# Patient Record
Sex: Male | Born: 1960 | Race: Black or African American | Hispanic: No | Marital: Married | Smoking: Never smoker
Health system: Southern US, Community
[De-identification: ages and names within clinical notes are randomized; demographics above are authoritative.]

## PROBLEM LIST (undated history)

## (undated) DIAGNOSIS — I10 Essential (primary) hypertension: Secondary | ICD-10-CM

## (undated) HISTORY — PX: KNEE SURGERY: SHX244

## (undated) HISTORY — PX: SHOULDER SURGERY: SHX246

---

## 2014-06-07 ENCOUNTER — Emergency Department: Payer: Self-pay | Admitting: Emergency Medicine

## 2014-06-07 LAB — CBC
HCT: 45.6 % (ref 40.0–52.0)
HGB: 14.8 g/dL (ref 13.0–18.0)
MCH: 28.5 pg (ref 26.0–34.0)
MCHC: 32.6 g/dL (ref 32.0–36.0)
MCV: 88 fL (ref 80–100)
Platelet: 197 10*3/uL (ref 150–440)
RBC: 5.2 10*6/uL (ref 4.40–5.90)
RDW: 13.7 % (ref 11.5–14.5)
WBC: 5.1 10*3/uL (ref 3.8–10.6)

## 2014-06-07 LAB — COMPREHENSIVE METABOLIC PANEL
ALT: 52 U/L
ANION GAP: 8 (ref 7–16)
AST: 49 U/L — AB (ref 15–37)
Albumin: 3.7 g/dL (ref 3.4–5.0)
Alkaline Phosphatase: 64 U/L
BILIRUBIN TOTAL: 0.5 mg/dL (ref 0.2–1.0)
BUN: 22 mg/dL — AB (ref 7–18)
CHLORIDE: 99 mmol/L (ref 98–107)
CREATININE: 1.35 mg/dL — AB (ref 0.60–1.30)
Calcium, Total: 8.7 mg/dL (ref 8.5–10.1)
Co2: 29 mmol/L (ref 21–32)
EGFR (African American): >60
EGFR (Non-African Amer.): >60
GLUCOSE: 122 mg/dL — AB (ref 65–99)
Osmolality: 277 (ref 275–301)
Potassium: 2.9 mmol/L — ABNORMAL LOW (ref 3.5–5.1)
Sodium: 136 mmol/L (ref 136–145)
TOTAL PROTEIN: 8.2 g/dL (ref 6.4–8.2)

## 2014-06-07 LAB — TROPONIN I
Troponin-I: <0.02 ng/mL
Troponin-I: <0.02 ng/mL

## 2017-07-03 ENCOUNTER — Emergency Department
Admission: EM | Admit: 2017-07-03 | Discharge: 2017-07-03 | Disposition: A | Discharge status: Home / Self Care | Payer: 59 | Attending: Emergency Medicine | Admitting: Emergency Medicine

## 2017-07-03 ENCOUNTER — Encounter: Payer: Self-pay | Admitting: Emergency Medicine

## 2017-07-03 DIAGNOSIS — R197 Diarrhea, unspecified: Secondary | ICD-10-CM | POA: Diagnosis not present

## 2017-07-03 DIAGNOSIS — B9621 Shiga toxin-producing Escherichia coli [E. coli] (STEC) O157 as the cause of diseases classified elsewhere: Secondary | ICD-10-CM | POA: Insufficient documentation

## 2017-07-03 DIAGNOSIS — E876 Hypokalemia: Secondary | ICD-10-CM | POA: Insufficient documentation

## 2017-07-03 DIAGNOSIS — R112 Nausea with vomiting, unspecified: Secondary | ICD-10-CM | POA: Insufficient documentation

## 2017-07-03 DIAGNOSIS — A498 Other bacterial infections of unspecified site: Secondary | ICD-10-CM

## 2017-07-03 DIAGNOSIS — I10 Essential (primary) hypertension: Secondary | ICD-10-CM | POA: Diagnosis not present

## 2017-07-03 HISTORY — DX: Essential (primary) hypertension: I10

## 2017-07-03 LAB — CBC
HCT: 44.3 % (ref 40.0–52.0)
Hemoglobin: 15.3 g/dL (ref 13.0–18.0)
MCH: 29.1 pg (ref 26.0–34.0)
MCHC: 34.5 g/dL (ref 32.0–36.0)
MCV: 84.5 fL (ref 80.0–100.0)
Platelets: 187 10*3/uL (ref 150–440)
RBC: 5.24 MIL/uL (ref 4.40–5.90)
RDW: 13.7 % (ref 11.5–14.5)
WBC: 5.7 10*3/uL (ref 3.8–10.6)

## 2017-07-03 LAB — COMPREHENSIVE METABOLIC PANEL
ALBUMIN: 4.2 g/dL (ref 3.5–5.0)
ALK PHOS: 60 U/L (ref 38–126)
ALT: 27 U/L (ref 17–63)
AST: 33 U/L (ref 15–41)
Anion gap: 11 (ref 5–15)
BILIRUBIN TOTAL: 0.6 mg/dL (ref 0.3–1.2)
BUN: 25 mg/dL — AB (ref 6–20)
CALCIUM: 9.2 mg/dL (ref 8.9–10.3)
CO2: 28 mmol/L (ref 22–32)
CREATININE: 1.19 mg/dL (ref 0.61–1.24)
Chloride: 97 mmol/L — ABNORMAL LOW (ref 101–111)
GFR calc Af Amer: >60 mL/min (ref >60)
GFR calc non Af Amer: >60 mL/min (ref >60)
GLUCOSE: 109 mg/dL — AB (ref 65–99)
Potassium: 2.6 mmol/L — CL (ref 3.5–5.1)
SODIUM: 136 mmol/L (ref 135–145)
TOTAL PROTEIN: 7.9 g/dL (ref 6.5–8.1)

## 2017-07-03 LAB — GASTROINTESTINAL PANEL BY PCR, STOOL (REPLACES STOOL CULTURE)
ASTROVIRUS: NOT DETECTED
Adenovirus F40/41: NOT DETECTED
CYCLOSPORA CAYETANENSIS: NOT DETECTED
Campylobacter species: NOT DETECTED
Cryptosporidium: NOT DETECTED
E. COLI O157: NOT DETECTED
ENTAMOEBA HISTOLYTICA: NOT DETECTED
ENTEROAGGREGATIVE E COLI (EAEC): NOT DETECTED
Enterotoxigenic E coli (ETEC): NOT DETECTED
Giardia lamblia: NOT DETECTED
NOROVIRUS GI/GII: NOT DETECTED
Plesimonas shigelloides: NOT DETECTED
Rotavirus A: NOT DETECTED
SALMONELLA SPECIES: NOT DETECTED
SAPOVIRUS (I, II, IV, AND V): NOT DETECTED
SHIGELLA/ENTEROINVASIVE E COLI (EIEC): NOT DETECTED
Shiga like toxin producing E coli (STEC): DETECTED — AB
VIBRIO CHOLERAE: NOT DETECTED
Vibrio species: NOT DETECTED
Yersinia enterocolitica: NOT DETECTED

## 2017-07-03 LAB — C DIFFICILE QUICK SCREEN W PCR REFLEX
C DIFFICILE (CDIFF) TOXIN: NEGATIVE
C DIFFICLE (CDIFF) ANTIGEN: NEGATIVE
C Diff interpretation: NOT DETECTED

## 2017-07-03 LAB — LIPASE, BLOOD: Lipase: 28 U/L (ref 11–51)

## 2017-07-03 MED ORDER — ONDANSETRON HCL 4 MG/2ML IJ SOLN
4.0000 mg | Freq: Once | INTRAMUSCULAR | Status: AC
  Administered 2017-07-03: 4 mg via INTRAVENOUS
  Filled 2017-07-03: qty 2

## 2017-07-03 MED ORDER — POTASSIUM CHLORIDE CRYS ER 20 MEQ PO TBCR
40.0000 meq | EXTENDED_RELEASE_TABLET | Freq: Once | ORAL | Status: AC
  Administered 2017-07-03: 40 meq via ORAL
  Filled 2017-07-03: qty 2

## 2017-07-03 MED ORDER — ONDANSETRON 4 MG PO TBDP: 4.0000 mg | ORAL_TABLET | Freq: Three times a day (TID) | ORAL | 0 refills | Status: DC | PRN

## 2017-07-03 MED ORDER — POTASSIUM CHLORIDE 10 MEQ/100ML IV SOLN
10.0000 meq | INTRAVENOUS | Status: AC
  Administered 2017-07-03 (×3): 10 meq via INTRAVENOUS
  Filled 2017-07-03 (×4): qty 100

## 2017-07-03 MED ORDER — ONDANSETRON 4 MG PO TBDP
4.0000 mg | ORAL_TABLET | Freq: Once | ORAL | Status: AC | PRN
  Administered 2017-07-03: 4 mg via ORAL
  Filled 2017-07-03: qty 1

## 2017-07-03 MED ORDER — SODIUM CHLORIDE 0.9 % IV BOLUS (SEPSIS)
1000.0000 mL | Freq: Once | INTRAVENOUS | Status: AC
  Administered 2017-07-03: 1000 mL via INTRAVENOUS

## 2017-07-03 NOTE — ED Triage Notes (Signed)
Patient come in with co diarrhea for last 3 days after eating at [employer].  Pt states it was formed at first and now he is use nauseated and the stool is mostly watery.  VS WNL.

## 2017-07-03 NOTE — ED Provider Notes (Signed)
[facility] Emergency Department Provider Note  ____________________________________________  Time seen: Approximately 7:31 AM  I have reviewed the triage vital signs and the nursing notes.   HISTORY  Chief Complaint Diarrhea and Nausea    HPI John Pennington is a 56 y.o. male with a history of hypertension, without any history of abdominal surgery, presenting with nausea without vomiting, and loose stool. The patient reports that he ate at [employer] yesterday, and within several hours began to develop loose, nonbloody watery and nonbloody stool associated with nausea but no vomiting. He has had 6-7 episodes of diarrhea since yesterday.He denies any fevers or chills, urinary symptoms, or abdominal pain. He has not tried anything for his pain. He has associated anorexia.   Past Medical History:  Diagnosis Date  . Hypertension     There are no active problems to display for this patient.   Past Surgical History:  Procedure Laterality Date  . KNEE SURGERY Left   . SHOULDER SURGERY Right     Current Outpatient Rx  . Order #: [id number] Class: Historical Med  . Order #: [id number] Class: Historical Med  . Order #: [id number] Class: Historical Med  . Order #: [id number] Class: Print    Allergies Patient has no known allergies.  No family history on file.  Social History Social History  Substance Use Topics  . Smoking status: Never Smoker  . Smokeless tobacco: Never Used  . Alcohol use No    Review of Systems Constitutional: No fever/chills.no lightheadedness or syncope. Eyes: No visual changes. ENT: No sore throat. No congestion or rhinorrhea. Cardiovascular: Denies chest pain. Denies palpitations. Respiratory: Denies shortness of breath.  No cough. Gastrointestinal: positive anorexia.No abdominal pain.  No nausea, no vomiting.  positivediarrhea.  No constipation. Genitourinary: Negative for dysuria. Musculoskeletal: Negative for back pain. Skin:  Negative for rash. Neurological: Negative for headaches. No focal numbness, tingling or weakness.     ____________________________________________   PHYSICAL EXAM:  VITAL SIGNS: ED Triage Vitals [07/03/17 0536]  Enc Vitals Group     BP 126/88     Pulse Rate 87     Resp 16     Temp 98.1 F (36.7 C)     Temp Source Oral     SpO2 97 %     Weight      Height      Head Circumference      Peak Flow      Pain Score      Pain Loc      Pain Edu?      Excl. in GC?     Constitutional: Alert and oriented. Well appearing and in no acute distress. Answers questions appropriately. Eyes: Conjunctivae are normal.  EOMI. No scleral icterus. Head: Atraumatic. Nose: No congestion/rhinnorhea. Mouth/Throat: Mucous membranes are mildly dry.  Neck: No stridor.  Supple.  No JVD. No meningismus. Cardiovascular: Normal rate, regular rhythm. No murmurs, rubs or gallops.  Respiratory: Normal respiratory effort.  No accessory muscle use or retractions. Lungs CTAB.  No wheezes, rales or ronchi. Gastrointestinal: overweight. Soft, nontender and nondistended.  No guarding or rebound.  No peritoneal signs. Musculoskeletal: moves all extremities well Neurologic:  A&Ox3.  Speech is clear.  Face and smile are symmetric.  EOMI.  Moves all extremities well. Skin:  Skin is warm, dry and intact. No rash noted. Psychiatric: Mood and affect are normal. Speech and behavior are normal.  Normal judgement.  ____________________________________________   LABS (all labs ordered are listed, but only abnormal results are  displayed)  Labs Reviewed  GASTROINTESTINAL PANEL BY PCR, STOOL (REPLACES STOOL CULTURE) - Abnormal; Notable for the following:       Result Value   Shiga like toxin producing E coli (STEC) DETECTED (*)    All other components within normal limits  COMPREHENSIVE METABOLIC PANEL - Abnormal; Notable for the following:    Potassium 2.6 (*)    Chloride 97 (*)    Glucose, Bld 109 (*)    BUN 25 (*)     All other components within normal limits  C DIFFICILE QUICK SCREEN W PCR REFLEX  LIPASE, BLOOD  CBC  URINALYSIS, COMPLETE (UACMP) WITH MICROSCOPIC   ____________________________________________  EKG  ED ECG REPORT I, Rockne Menghini, the attending physician, personally viewed and interpreted this ECG.   Date: 07/03/2017  EKG Time: 806  Rate: 72  Rhythm: normal sinus rhythm  Axis: leftward  Intervals:none  ST&T Change: No STEMI; nonspecific T-wave inversion in V1.  ____________________________________________  RADIOLOGY  No results found.  ____________________________________________   PROCEDURES  Procedure(s) performed: None  Procedures  Critical Care performed: No ____________________________________________   INITIAL IMPRESSION / ASSESSMENT AND PLAN / ED COURSE  Pertinent labs & imaging results that were available during my care of the patient were reviewed by me and considered in my medical decision making (see chart for details).  56 y.o. male presenting with multiple episodes of loose nonbloody stool, nausea without vomiting, and no abdominal pain or fever, since eating at [employer]. I'm concerned about a foodborne illness, and the patient will have a stool sample that will be sent for PCR panel. On my examination, the patient has a reassuring abdominal exam that is not suggestive of an acute intra-abdominal surgical pathology. Appendicitis or gallbladder disease is possible but less likely given this exam.  At this time, abdominal imaging is not indicated. The patient's laboratory studies are concerning for a potassium of 2.6 and he will received both oral and IV supplementation. He will also receive intravenous fluids for hydration. Plan reevaluation for final disposition.  ----------------------------------------- 1:20 PM on 07/03/2017 -----------------------------------------  Patient has remained hemodynamically stable in the emergency  department and has received his fluid and potassium supplementation. His stool did come back positive for Shiga-like toxin producing Escherichia coli, and antibiotics are contraindicated given the risk of precipitating hemolytic uremic syndrome.I have talked to the patient about supportive care, avoiding antibiotics and antidiarrheals. I will discharge him home with Zofran for his nausea. We have spoken at length about maintaining hydration. Follow up instructions as well as return precautions were discussed.  ____________________________________________  FINAL CLINICAL IMPRESSION(S) / ED DIAGNOSES  Final diagnoses:  Nausea vomiting and diarrhea  Hypokalemia  STEC (Shiga toxin-producing Escherichia coli)         NEW MEDICATIONS STARTED DURING THIS VISIT:  New Prescriptions   ONDANSETRON (ZOFRAN ODT) 4 MG DISINTEGRATING TABLET    Take 1 tablet (4 mg total) by mouth every 8 (eight) hours as needed for nausea or vomiting.      Rockne Menghini, MD 07/03/17 1321

## 2017-07-03 NOTE — ED Notes (Signed)
Date and time results received: 07/03/17 0635 (use smartphrase ".now" to insert current time)  Test: potassium Critical Value: 2.6  Name of Provider Notified: Dr. Zenda Alpers  Orders Received? Or Actions Taken?: acknowledged

## 2017-07-03 NOTE — ED Notes (Signed)
Patient denies pain and is resting comfortably.  

## 2017-07-03 NOTE — Discharge Instructions (Addendum)
Please take a clear liquid diet for the next 24 hours, then advance to a bland diet as tolerated, you may take Zofran for nausea and vomiting.  DO NOT TAKE ANTIBIOTICS OR ANTI-DIARRHEAL MEDICATIONS with this infection because it can put you at risk for developing worsening conditions.  Please make sure you wash your hands frequently and well, in case your symptoms are related to an infection, so you do not infect other people.  Return to the emergency department if you develop severe pain, fever, lightheadedness or fainting, inability to keep down fluids, concerns for dehydration, or any other symptoms concerning to you.

## 2017-08-14 LAB — MISCELLANEOUS TEST

## 2017-09-03 ENCOUNTER — Emergency Department
Admission: EM | Admit: 2017-09-03 | Discharge: 2017-09-03 | Disposition: A | Discharge status: Home / Self Care | Payer: 59 | Attending: Emergency Medicine | Admitting: Emergency Medicine

## 2017-09-03 ENCOUNTER — Emergency Department: Payer: 59

## 2017-09-03 ENCOUNTER — Encounter: Payer: Self-pay | Admitting: Emergency Medicine

## 2017-09-03 ENCOUNTER — Other Ambulatory Visit: Payer: Self-pay

## 2017-09-03 DIAGNOSIS — I10 Essential (primary) hypertension: Secondary | ICD-10-CM | POA: Insufficient documentation

## 2017-09-03 DIAGNOSIS — W010XXA Fall on same level from slipping, tripping and stumbling without subsequent striking against object, initial encounter: Secondary | ICD-10-CM | POA: Diagnosis not present

## 2017-09-03 DIAGNOSIS — Y939 Activity, unspecified: Secondary | ICD-10-CM | POA: Diagnosis not present

## 2017-09-03 DIAGNOSIS — S8992XA Unspecified injury of left lower leg, initial encounter: Secondary | ICD-10-CM | POA: Diagnosis present

## 2017-09-03 DIAGNOSIS — S86912A Strain of unspecified muscle(s) and tendon(s) at lower leg level, left leg, initial encounter: Secondary | ICD-10-CM | POA: Insufficient documentation

## 2017-09-03 DIAGNOSIS — Y999 Unspecified external cause status: Secondary | ICD-10-CM | POA: Insufficient documentation

## 2017-09-03 DIAGNOSIS — Z79899 Other long term (current) drug therapy: Secondary | ICD-10-CM | POA: Diagnosis not present

## 2017-09-03 DIAGNOSIS — Y92009 Unspecified place in unspecified non-institutional (private) residence as the place of occurrence of the external cause: Secondary | ICD-10-CM | POA: Insufficient documentation

## 2017-09-03 MED ORDER — TRAMADOL HCL 50 MG PO TABS: 50.0000 mg | ORAL_TABLET | Freq: Four times a day (QID) | ORAL | 0 refills | Status: AC | PRN

## 2017-09-03 NOTE — Discharge Instructions (Signed)
Follow up with orthopedics for symptoms that are not improving over the week.  Wear your knee brace while out of bed until pain is gone or you see the orthopedist.  Return to the ER for symptoms that change or worsen if unable to schedule an appointment.

## 2017-09-03 NOTE — ED Triage Notes (Signed)
Pt to ED via POV c/o left knee pain, swelling, and decreased ROM. Pt states that he fell and landed on his knee yesterday. Pt has had surgery on left knee x 2. Pt in NAD at this time.

## 2017-09-03 NOTE — ED Provider Notes (Signed)
[facility] Emergency Department Provider Note ____________________________________________  Time seen: Approximately 10:39 AM  I have reviewed the triage vital signs and the nursing notes.   HISTORY  Chief Complaint Knee Pain    HPI Tod PersiaCecil Lee Moder is a 56 y.o. male who presents to the emergency department for evaluation after a mechanical, non-syncopal fall yesterday at home while carrying a tree topper and tripping over the cord. He was unable to brace his fall and hit his left knee on the ground. He had meniscal tears that required surgery in the past. He has taken his meloxicam without relief.   Past Medical History:  Diagnosis Date  . Hypertension     There are no active problems to display for this patient.   Past Surgical History:  Procedure Laterality Date  . KNEE SURGERY Left   . SHOULDER SURGERY Right     Prior to Admission medications   Medication Sig Start Date End Date Taking? Authorizing Provider  chlorthalidone (HYGROTON) 25 MG tablet Take 25 mg by mouth daily. 05/25/17   [provider]  fluticasone (FLONASE) 50 MCG/ACT nasal spray Place 2 sprays into both nostrils daily as needed for allergies or rhinitis.    [provider]  meloxicam (MOBIC) 15 MG tablet Take 15 mg by mouth daily. 05/25/17   [provider]  ondansetron (ZOFRAN ODT) 4 MG disintegrating tablet Take 1 tablet (4 mg total) by mouth every 8 (eight) hours as needed for nausea or vomiting. 07/03/17   Rockne MenghiniNorman, Anne-Caroline, MD  traMADol (ULTRAM) 50 MG tablet Take 1 tablet (50 mg total) by mouth every 6 (six) hours as needed. 09/03/17   Chinita Pesterriplett, Wylie Russon B, FNP    Allergies Patient has no known allergies.  No family history on file.  Social History Social History   Tobacco Use  . Smoking status: Never Smoker  . Smokeless tobacco: Never Used  Substance Use Topics  . Alcohol use: No  . Drug use: No    Review of Systems Constitutional:  Negative for recent illness Cardiovascular: Negative for chest pain Respiratory: Negative for shortness of breath Musculoskeletal: Positive for left knee pain Skin: Negative for rash, lesion, or wound.  Neurological: Negative for loss of consciousness post fall  ____________________________________________   PHYSICAL EXAM:  VITAL SIGNS: ED Triage Vitals  Enc Vitals Group     BP 09/03/17 1036 131/87     Pulse Rate 09/03/17 1036 80     Resp 09/03/17 1036 16     Temp 09/03/17 1036 98.2 F (36.8 C)     Temp Source 09/03/17 1036 Oral     SpO2 09/03/17 1036 98 %     Weight 09/03/17 1034 295 lb (133.8 kg)     Height 09/03/17 1034 6\' 1"  (1.854 m)     Head Circumference --      Peak Flow --      Pain Score 09/03/17 1034 8     Pain Loc --      Pain Edu? --      Excl. in GC? --     Constitutional: Alert and oriented. Well appearing and in no acute distress. Eyes: Conjunctivae are clear without discharge or drainage Head: Atraumatic Neck: Supple, Nexus Criteria negative Respiratory: Even and unlabored. Musculoskeletal: Tenderness over the proximal tibia. Negative anterior or posterior drawer tests. Negative Ballottement test. Neurologic: Sensation and motion of the LLE intact.  Skin: Atraumatic.  Psychiatric: Behavior and affect are appropriate.  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 ____________________________________________   PROCEDURES  Procedures  ____________________________________________   INITIAL IMPRESSION / ASSESSMENT AND PLAN / ED COURSE  Tod PersiaCecil Lee Mineer is a 56 y.o. male who presents to the emergency department for treatment and evaluation after sustaining a mechanical, non-syncopal fall yesterday.  X-rays negative for acute bony abnormality. He will wear his hinged brace that he had post surgery and follow up with the orthopedist if not improving over the week. He will also be given a short course of tramadol. He was advised to return to the ER for symptoms that change or worsen if unable to see his PCP or ortho.  Medications - No data to display  Pertinent labs & imaging results that were available during my care of the patient were reviewed by me and considered in my medical decision making (see chart for details).  _________________________________________   FINAL CLINICAL IMPRESSION(S) / ED DIAGNOSES  Final diagnoses:  Strain of left knee, initial encounter    ED Discharge Orders        Ordered    traMADol (ULTRAM) 50 MG tablet  Every 6 hours PRN     09/03/17 1125       If controlled substance prescribed during this visit, 12 month history viewed on the [facility] prior to issuing an initial prescription for Schedule II or III opiod.    Chinita Pesterriplett, Bearett Porcaro B, FNP 09/03/17 1142    Sharyn CreamerQuale, Mark, MD 09/03/17 1924

## 2018-07-15 ENCOUNTER — Encounter: Payer: Self-pay | Admitting: Emergency Medicine

## 2018-07-15 ENCOUNTER — Emergency Department
Admission: EM | Admit: 2018-07-15 | Discharge: 2018-07-15 | Disposition: A | Discharge status: Home / Self Care | Payer: 59 | Attending: Emergency Medicine | Admitting: Emergency Medicine

## 2018-07-15 DIAGNOSIS — H66002 Acute suppurative otitis media without spontaneous rupture of ear drum, left ear: Secondary | ICD-10-CM | POA: Diagnosis not present

## 2018-07-15 DIAGNOSIS — I10 Essential (primary) hypertension: Secondary | ICD-10-CM | POA: Insufficient documentation

## 2018-07-15 DIAGNOSIS — R51 Headache: Secondary | ICD-10-CM | POA: Diagnosis present

## 2018-07-15 DIAGNOSIS — Z79899 Other long term (current) drug therapy: Secondary | ICD-10-CM | POA: Diagnosis not present

## 2018-07-15 MED ORDER — AMOXICILLIN 500 MG PO CAPS
1000.0000 mg | ORAL_CAPSULE | Freq: Once | ORAL | Status: AC
  Administered 2018-07-15: 1000 mg via ORAL
  Filled 2018-07-15: qty 2

## 2018-07-15 MED ORDER — LIDOCAINE VISCOUS HCL 2 % MT SOLN
OROMUCOSAL | Status: AC
  Filled 2018-07-15: qty 15

## 2018-07-15 MED ORDER — AMOXICILLIN 500 MG PO TABS: 1000.0000 mg | ORAL_TABLET | Freq: Two times a day (BID) | ORAL | 0 refills | Status: AC

## 2018-07-15 MED ORDER — LIDOCAINE VISCOUS HCL 2 % MT SOLN
15.0000 mL | Freq: Once | OROMUCOSAL | Status: AC
  Administered 2018-07-15: 15 mL via OROMUCOSAL
  Filled 2018-07-15: qty 15

## 2018-07-15 MED ORDER — TETRACAINE HCL 0.5 % OP SOLN: 2.0000 [drp] | Freq: Once | OPHTHALMIC | 0 refills | Status: AC

## 2018-07-15 NOTE — ED Provider Notes (Signed)
[facility] Emergency Department Provider Note  ____________________________________________   First MD Initiated Contact with Patient 07/15/18 [phone]     (approximate)  I have reviewed the triage vital signs and the nursing notes.   HISTORY  Chief Complaint Headache and Otalgia   HPI John Pennington is a 57 y.o. male comes to the emergency department with gradual onset not maximal onset left-sided ear pain and subsequent headache that began last night.  He feels like he has decreased hearing in his left ear.  No fevers or chills.  No difficulty swallowing.  No shortness of breath.  No discharge from his ear.  He is not diabetic and he does not swim.  He does take meloxicam daily for chronic pain.    Past Medical History:  Diagnosis Date  . Hypertension     There are no active problems to display for this patient.   Past Surgical History:  Procedure Laterality Date  . KNEE SURGERY Left   . SHOULDER SURGERY Right     Prior to Admission medications   Medication Sig Start Date End Date Taking? Authorizing Provider  amoxicillin (AMOXIL) 500 MG tablet Take 2 tablets (1,000 mg total) by mouth 2 (two) times daily for 7 days. 07/15/18 07/22/18  Merrily Brittle, MD  chlorthalidone (HYGROTON) 25 MG tablet Take 25 mg by mouth daily. 05/25/17   [provider]  fluticasone (FLONASE) 50 MCG/ACT nasal spray Place 2 sprays into both nostrils daily as needed for allergies or rhinitis.    [provider]  meloxicam (MOBIC) 15 MG tablet Take 15 mg by mouth daily. 05/25/17   [provider]  ondansetron (ZOFRAN ODT) 4 MG disintegrating tablet Take 1 tablet (4 mg total) by mouth every 8 (eight) hours as needed for nausea or vomiting. 07/03/17   Rockne Menghini, MD  traMADol (ULTRAM) 50 MG tablet Take 1 tablet (50 mg total) by mouth every 6 (six) hours as needed. 09/03/17   Chinita Pester, FNP    Allergies Patient has no known  allergies.  No family history on file.  Social History Social History   Tobacco Use  . Smoking status: Never Smoker  . Smokeless tobacco: Never Used  Substance Use Topics  . Alcohol use: No  . Drug use: Not Currently    Types: Marijuana    Review of Systems Constitutional: No fever/chills ENT: Positive for ear pain Cardiovascular: Denies chest pain. Respiratory: Denies shortness of breath. Gastrointestinal: No abdominal pain.  No nausea, no vomiting.   Neurological: Positive for headaches   ____________________________________________   PHYSICAL EXAM:  VITAL SIGNS: ED Triage Vitals [07/15/18 0421]  Enc Vitals Group     BP 130/79     Pulse Rate 94     Resp 18     Temp 97.7 F (36.5 C)     Temp Source Oral     SpO2 98 %     Weight 295 lb (133.8 kg)     Height 6\' 1"  (1.854 m)     Head Circumference      Peak Flow      Pain Score 10     Pain Loc      Pain Edu?      Excl. in GC?     Constitutional: Alert and oriented x4 appears uncomfortable nontoxic no diaphoresis speaks in full clear sentences Head: Atraumatic.  Normal tympanic membrane on the right.  Left tympanic membrane erythematous and bulging.  No mastoid tenderness.  No  lesions in the canal and no signs of otitis externa Nose: No congestion/rhinnorhea. Mouth/Throat: No trismus Neck: No stridor.   Cardiovascular: Regular rate and rhythm Respiratory: Normal respiratory effort.  No retractions. Gastrointestinal: Soft nontender Neurologic:  Normal speech and language. No gross focal neurologic deficits are appreciated.  Skin:  Skin is warm, dry and intact. No rash noted.    ____________________________________________  LABS (all labs ordered are listed, but only abnormal results are displayed)  Labs Reviewed - No data to  display   __________________________________________  EKG   ____________________________________________  RADIOLOGY   ____________________________________________   DIFFERENTIAL includes but not limited to  Otitis media, otitis externa, Ramsay Hunt syndrome, intracerebral hemorrhage, upper respiratory tract infection   PROCEDURES  Procedure(s) performed: no  Procedures  Critical Care performed: no  ____________________________________________   INITIAL IMPRESSION / ASSESSMENT AND PLAN / ED COURSE  Pertinent labs & imaging results that were available during my care of the patient were reviewed by me and considered in my medical decision making (see chart for details).   As part of my medical decision making, I reviewed the following data within the electronic MEDICAL RECORD NUMBER History obtained from family if available, nursing notes, old chart and ekg, as well as notes from prior ED visits.  The patient's symptoms are consistent with new onset separative otitis media.  I placed viscous lidocaine in his left ear canal with significant improvement in his symptoms.  He has had no recent antibiotics I will treat him with tetracaine drops for the pain as well as 1 week of amoxicillin.  Strict return precautions have been given.      ____________________________________________   FINAL CLINICAL IMPRESSION(S) / ED DIAGNOSES  Final diagnoses:  Non-recurrent acute suppurative otitis media of left ear without spontaneous rupture of tympanic membrane      NEW MEDICATIONS STARTED DURING THIS VISIT:  Discharge Medication List as of 07/15/2018  5:09 AM    START taking these medications   Details  amoxicillin (AMOXIL) 500 MG tablet Take 2 tablets (1,000 mg total) by mouth 2 (two) times daily for 7 days., Starting Sun 07/15/2018, Until Sun 07/22/2018, Print    tetracaine (PONTOCAINE) 0.5 % ophthalmic solution Place 2 drops into the left eye once for 1 dose. Please use this  eye drop in your left EAR three times a day as needed for ear pain., Starting Sun 07/15/2018, Print         Note:  This document was prepared using Dragon voice recognition software and may include unintentional dictation errors.      Merrily Brittle, MD 07/17/18 [phone]

## 2018-07-15 NOTE — Discharge Instructions (Signed)
It was a pleasure to take care of you today, and thank you for coming to our emergency department.  If you have any questions or concerns before leaving please ask the nurse to grab me and I'm more than happy to go through your aftercare instructions again. ° °If you have any concerns once you are home that you are not improving or are in fact getting worse before you can make it to your follow-up appointment, please do not hesitate to call [phone] and come back for further evaluation. ° °Thirza Pellicano, MD ° ° ° °

## 2018-07-15 NOTE — ED Triage Notes (Signed)
Patient states that he had cough and congestion earlier in the week. Patient states that he developed a headache and left ear pain that started last night. Patient states that he has not taken any OTC medications for his headache.

## 2018-08-10 ENCOUNTER — Ambulatory Visit: Payer: 59 | Admitting: Podiatry

## 2018-08-10 ENCOUNTER — Encounter: Payer: Self-pay | Admitting: Podiatry

## 2018-08-10 VITALS — BP 133/92 | HR 78

## 2018-08-10 DIAGNOSIS — M779 Enthesopathy, unspecified: Secondary | ICD-10-CM | POA: Diagnosis not present

## 2018-08-10 DIAGNOSIS — L84 Corns and callosities: Secondary | ICD-10-CM | POA: Diagnosis not present

## 2018-08-10 DIAGNOSIS — M21622 Bunionette of left foot: Secondary | ICD-10-CM | POA: Diagnosis not present

## 2018-08-10 MED ORDER — TRIAMCINOLONE ACETONIDE 10 MG/ML IJ SUSP
10.0000 mg | Freq: Once | INTRAMUSCULAR | Status: AC
  Administered 2018-08-10: 10 mg

## 2018-08-12 NOTE — Progress Notes (Signed)
Subjective:   Patient ID: John Pennington, male   DOB: 57 y.o.   MRN: [id number]   HPI Patient presents stating he has had a lot of pain in the outside of the left foot with inflammation fluid around the head of the metatarsal with keratotic tissue formation and states is worsened over the last 6 months.  Does not remember specific injury but states it is bothersome.  Patient does not smoke and likes to be active   Review of Systems  All other systems reviewed and are negative.       Objective:  Physical Exam  Constitutional: He appears well-developed and well-nourished.  Cardiovascular: Intact distal pulses.  Pulmonary/Chest: Effort normal.  Musculoskeletal: Normal range of motion.  Neurological: He is alert.  Skin: Skin is warm.  Nursing note and vitals reviewed.   Neurovascular status was found to be intact muscle strength is adequate range of motion within normal limits with patient found to have inflammation fluid buildup of keratotic lesion fifth metatarsal head left is painful when palpated.  Patient does have good digital perfusion and is well oriented x3     Assessment:  Inflammatory capsulitis fifth MPJ left with fluid buildup around the joint and pain with palpation     Plan:  H&P condition reviewed and discussed the bony structural condition associated with this and the fact surgical intervention may be necessary one point future.  Today we will get a try conservative and I did sterile prep of the left lateral foot and injected the fifth MPJ 3 mg Dexasone Kenalog 5 mg Xylocaine and debrided the lesion and advised on reduced activity.  Reappoint for [place] to recheck

## 2018-11-26 ENCOUNTER — Encounter: Payer: Self-pay | Admitting: Emergency Medicine

## 2018-11-26 ENCOUNTER — Emergency Department
Admission: EM | Admit: 2018-11-26 | Discharge: 2018-11-26 | Disposition: A | Discharge status: Home / Self Care | Payer: 59 | Attending: Emergency Medicine | Admitting: Emergency Medicine

## 2018-11-26 ENCOUNTER — Other Ambulatory Visit: Payer: Self-pay

## 2018-11-26 DIAGNOSIS — E876 Hypokalemia: Secondary | ICD-10-CM | POA: Diagnosis not present

## 2018-11-26 DIAGNOSIS — A0832 Astrovirus enteritis: Secondary | ICD-10-CM | POA: Insufficient documentation

## 2018-11-26 DIAGNOSIS — R197 Diarrhea, unspecified: Secondary | ICD-10-CM | POA: Diagnosis present

## 2018-11-26 DIAGNOSIS — I10 Essential (primary) hypertension: Secondary | ICD-10-CM | POA: Diagnosis not present

## 2018-11-26 DIAGNOSIS — R110 Nausea: Secondary | ICD-10-CM

## 2018-11-26 LAB — CBC
HEMATOCRIT: 49 % (ref 39.0–52.0)
Hemoglobin: 16.3 g/dL (ref 13.0–17.0)
MCH: 29 pg (ref 26.0–34.0)
MCHC: 33.3 g/dL (ref 30.0–36.0)
MCV: 87 fL (ref 80.0–100.0)
Platelets: 215 10*3/uL (ref 150–400)
RBC: 5.63 MIL/uL (ref 4.22–5.81)
RDW: 13.6 % (ref 11.5–15.5)
WBC: 5.5 10*3/uL (ref 4.0–10.5)
nRBC: 0 % (ref 0.0–0.2)

## 2018-11-26 LAB — COMPREHENSIVE METABOLIC PANEL
ALT: 42 U/L (ref 0–44)
AST: 39 U/L (ref 15–41)
Albumin: 4.3 g/dL (ref 3.5–5.0)
Alkaline Phosphatase: 59 U/L (ref 38–126)
Anion gap: 9 (ref 5–15)
BUN: 22 mg/dL — ABNORMAL HIGH (ref 6–20)
CO2: 32 mmol/L (ref 22–32)
Calcium: 9.2 mg/dL (ref 8.9–10.3)
Chloride: 99 mmol/L (ref 98–111)
Creatinine, Ser: 1.46 mg/dL — ABNORMAL HIGH (ref 0.61–1.24)
GFR calc non Af Amer: 53 mL/min — ABNORMAL LOW (ref >60)
Glucose, Bld: 101 mg/dL — ABNORMAL HIGH (ref 70–99)
Potassium: 3.3 mmol/L — ABNORMAL LOW (ref 3.5–5.1)
Sodium: 140 mmol/L (ref 135–145)
Total Bilirubin: 0.6 mg/dL (ref 0.3–1.2)
Total Protein: 8.3 g/dL — ABNORMAL HIGH (ref 6.5–8.1)

## 2018-11-26 LAB — GASTROINTESTINAL PANEL BY PCR, STOOL (REPLACES STOOL CULTURE)

## 2018-11-26 LAB — C DIFFICILE QUICK SCREEN W PCR REFLEX
C DIFFICILE (CDIFF) INTERP: NOT DETECTED
C Diff antigen: NEGATIVE
C Diff toxin: NEGATIVE

## 2018-11-26 LAB — URINALYSIS, COMPLETE (UACMP) WITH MICROSCOPIC
Bacteria, UA: NONE SEEN
Bilirubin Urine: NEGATIVE
Glucose, UA: NEGATIVE mg/dL
HGB URINE DIPSTICK: NEGATIVE
Ketones, ur: NEGATIVE mg/dL
Leukocytes,Ua: NEGATIVE
Nitrite: NEGATIVE
Protein, ur: NEGATIVE mg/dL
Specific Gravity, Urine: 1.027 (ref 1.005–1.030)
Squamous Epithelial / HPF: NONE SEEN (ref 0–5)
pH: 5 (ref 5.0–8.0)

## 2018-11-26 LAB — LIPASE, BLOOD: Lipase: 36 U/L (ref 11–51)

## 2018-11-26 MED ORDER — SODIUM CHLORIDE 0.9 % IV BOLUS
1000.0000 mL | Freq: Once | INTRAVENOUS | Status: AC
  Administered 2018-11-26: 1000 mL via INTRAVENOUS

## 2018-11-26 MED ORDER — ONDANSETRON 4 MG PO TBDP: 4.0000 mg | ORAL_TABLET | Freq: Three times a day (TID) | ORAL | 0 refills | Status: AC | PRN

## 2018-11-26 MED ORDER — ACETAMINOPHEN 500 MG PO TABS
1000.0000 mg | ORAL_TABLET | Freq: Once | ORAL | Status: AC
  Administered 2018-11-26: 1000 mg via ORAL
  Filled 2018-11-26: qty 2

## 2018-11-26 MED ORDER — POTASSIUM CHLORIDE CRYS ER 20 MEQ PO TBCR
40.0000 meq | EXTENDED_RELEASE_TABLET | Freq: Once | ORAL | Status: AC
  Administered 2018-11-26: 40 meq via ORAL
  Filled 2018-11-26: qty 2

## 2018-11-26 MED ORDER — ONDANSETRON HCL 4 MG/2ML IJ SOLN
4.0000 mg | Freq: Once | INTRAMUSCULAR | Status: AC
  Administered 2018-11-26: 4 mg via INTRAVENOUS
  Filled 2018-11-26: qty 2

## 2018-11-26 MED ORDER — LOPERAMIDE HCL 2 MG PO TABS: 2.0000 mg | ORAL_TABLET | Freq: Four times a day (QID) | ORAL | 0 refills | Status: DC | PRN

## 2018-11-26 NOTE — ED Provider Notes (Signed)
[facility] Emergency Department Provider Note  ____________________________________________  Time seen: Approximately 7:26 AM  I have reviewed the triage vital signs and the nursing notes.   HISTORY  Chief Complaint Nausea and Diarrhea    HPI John Pennington is a 58 y.o. male , with HTN, presenting with abdominal cramping, diarrhea, and nausea without vomiting.  The patient reports that on Friday he ate [place] food and several hours later began to have multiple episodes of loose and watery nonbloody diarrhea with abdominal cramping.  This has persisted for the last 3 or 4 days, and he has had nausea without vomiting.  He has not had any fevers, chills, lightheadedness or syncope.  He has not tried anything for his symptoms.  Past Medical History:  Diagnosis Date  . Hypertension     There are no active problems to display for this patient.   Past Surgical History:  Procedure Laterality Date  . KNEE SURGERY Left   . SHOULDER SURGERY Right     Current Outpatient Rx  . Order #: [id number] Class: Historical Med  . Order #: [id number] Class: Historical Med  . Order #: [id number] Class: Historical Med  . Order #: [id number] Class: Historical Med  . Order #: [id number] Class: Historical Med  . Order #: [id number] Class: Print  . Order #: [id number] Class: Historical Med  . Order #: [id number] Class: Print  . Order #: [id number] Class: Print    Allergies Patient has no known allergies.  History reviewed. No pertinent family history.  Social History Social History   Tobacco Use  . Smoking status: Never Smoker  . Smokeless tobacco: Never Used  Substance Use Topics  . Alcohol use: No  . Drug use: Not Currently    Types: Marijuana    Review of Systems Constitutional: No fever/chills.  No lightheadedness or syncope. Eyes: No visual changes. ENT: No sore throat. No congestion or rhinorrhea. Cardiovascular: Denies chest pain. Denies palpitations. Respiratory:  Denies shortness of breath.  No cough. Gastrointestinal: + abdominal cramping.  + nausea, no vomiting.  + diarrhea.  No constipation. Genitourinary: Negative for dysuria. Musculoskeletal: Negative for back pain. Skin: Negative for rash. Neurological: Negative for headaches. No focal numbness, tingling or weakness.     ____________________________________________   PHYSICAL EXAM:  VITAL SIGNS: ED Triage Vitals  Enc Vitals Group     BP 11/26/18 0108 131/82     Pulse Rate 11/26/18 0108 90     Resp 11/26/18 0108 18     Temp 11/26/18 0108 98.2 F (36.8 C)     Temp Source 11/26/18 0108 Oral     SpO2 11/26/18 0108 98 %     Weight 11/26/18 0109 298 lb (135.2 kg)     Height 11/26/18 0109 6\' 1"  (1.854 m)     Head Circumference --      Peak Flow --      Pain Score 11/26/18 0109 7     Pain Loc --      Pain Edu? --      Excl. in GC? --     Constitutional: Alert and oriented. Answers questions appropriately. Eyes: Conjunctivae are normal.  EOMI. No scleral icterus. Head: Atraumatic. Nose: No congestion/rhinnorhea. Mouth/Throat: Mucous membranes are moist.  Neck: No stridor.  Supple.   Cardiovascular: Normal rate, regular rhythm. No murmurs, rubs or gallops.  Respiratory: Normal respiratory effort.  No accessory muscle use or retractions. Lungs CTAB.  No wheezes, rales or ronchi. Gastrointestinal: Soft, nontender and nondistended.  No reproducible tenderness on my examination peer  no guarding or rebound.  No peritoneal signs. Musculoskeletal: No LE edema. No ttp in the calves or palpable cords.  Negative Homan's sign. Neurologic:  A&Ox3.  Speech is clear.  Face and smile are symmetric.  EOMI.  Moves all extremities well. Skin:  Skin is warm, dry and intact. No rash noted.  Normal skin turgor. Psychiatric: Mood and affect are normal. Speech and behavior are normal.  Normal judgement.  ____________________________________________   LABS (all labs ordered are listed, but only  abnormal results are displayed)  Labs Reviewed  GASTROINTESTINAL PANEL BY PCR, STOOL (REPLACES STOOL CULTURE) - Abnormal; Notable for the following components:      Result Value   Astrovirus DETECTED (*)    All other components within normal limits  COMPREHENSIVE METABOLIC PANEL - Abnormal; Notable for the following components:   Potassium 3.3 (*)    Glucose, Bld 101 (*)    BUN 22 (*)    Creatinine, Ser 1.46 (*)    Total Protein 8.3 (*)    GFR calc non Af Amer 53 (*)    All other components within normal limits  URINALYSIS, COMPLETE (UACMP) WITH MICROSCOPIC - Abnormal; Notable for the following components:   Color, Urine YELLOW (*)    APPearance CLEAR (*)    All other components within normal limits  C DIFFICILE QUICK SCREEN W PCR REFLEX  LIPASE, BLOOD  CBC   ____________________________________________  EKG  Not indicated ____________________________________________  RADIOLOGY  No results found.  ____________________________________________   PROCEDURES  Procedure(s) performed: None  Procedures  Critical Care performed: No ____________________________________________   INITIAL IMPRESSION / ASSESSMENT AND PLAN / ED COURSE  Pertinent labs & imaging results that were available during my care of the patient were reviewed by me and considered in my medical decision making (see chart for details).  58 y.o. male with a history of hypertension presenting with 4 days of ongoing loose and watery stool associated with nausea and abdominal cramping.  Overall, the patient is hemodynamically stable.  He does have evidence of mild dehydration and his laboratory studies including acute renal insufficiency.  However, clinically, his mucous membranes are moist and he has normal skin turgor.  His diarrhea may be due to foodborne illness, viral illness, will get PCR of the stool as well as C. difficile testing for evaluation of acute infection that would warrant antibiotic  intervention.  A flulike illness is not excluded but the patient is not having any respiratory symptoms and is not within the window for Tamiflu treatment so testing is not indicated.  His abdomen is soft and nontender; I do not see any evidence of an acute surgical pathology today and imaging is not indicated at this time.  Patient will receive intravenous fluids, an antiemetic, and Tylenol for pain control.  If he does not have evidence of a bacterial infection, will consider loperamide.  Plan reevaluation for final disposition.  ----------------------------------------- 11:49 AM on 11/26/2018 -----------------------------------------  Patient's work-up in the emergency department has been reassuring.  He did test positive for Astro virus, and I will discharge him home with supportive measures and symptomatic treatment.  Follow-up instructions as well as return precautions were discussed.  ____________________________________________  FINAL CLINICAL IMPRESSION(S) / ED DIAGNOSES  Final diagnoses:  Astrovirus enteritis  Nausea without vomiting  Hypokalemia         NEW MEDICATIONS STARTED DURING THIS VISIT:  New Prescriptions   LOPERAMIDE (IMODIUM A-D) 2 MG TABLET    Take 1 tablet (2 mg total)  by mouth 4 (four) times daily as needed for diarrhea or loose stools.   ONDANSETRON (ZOFRAN ODT) 4 MG DISINTEGRATING TABLET    Take 1 tablet (4 mg total) by mouth every 8 (eight) hours as needed.      Rockne Menghini, MD 11/26/18 1149

## 2018-11-26 NOTE — Discharge Instructions (Addendum)
Today you have a viral infection called Astrovirus.  The treatment is symptomatic, to treat your symptoms.  You may take Loperamide as needed for diarrhea.  Zofran is for nausea.  You may take Al-Anon or Motrin for your abdominal cramping.  If you take Motrin, please take it with food to prevent irritation of your stomach.  Please take a bland diet to help with your symptoms.  Wash hands frequently and thoroughly to prevent the spread of infection.  Return to the emergency department for severe pain, inability to keep down fluids, lightheadedness or fainting, fever, or any other symptoms concerning to you.

## 2018-11-26 NOTE — ED Triage Notes (Signed)
Pt ambulatory to triage with no difficulty. Pt reports diarrhea for the last two days after eating chinese food. Thinks he had food poisoning.

## 2018-11-26 NOTE — ED Notes (Signed)
Pt ambulatory with steady gait to stat registration; reports diarrhea x 2 days; thinks he may have food poisoning; c/o abd cramping; denies dizziness or weakness

## 2019-05-22 IMAGING — CR DG KNEE COMPLETE 4+V*L*
1 series · 4 of 4 positions shown · non-contrast
Comparison: None.

CLINICAL DATA: Left knee pain, swelling and decreased range of
motion.

EXAM:
LEFT KNEE - COMPLETE 4+ VIEW

[Series 1: dg knee complete 4 views left · 0.14mm/px · 4 of 4 slices shown]
[im 1/4]
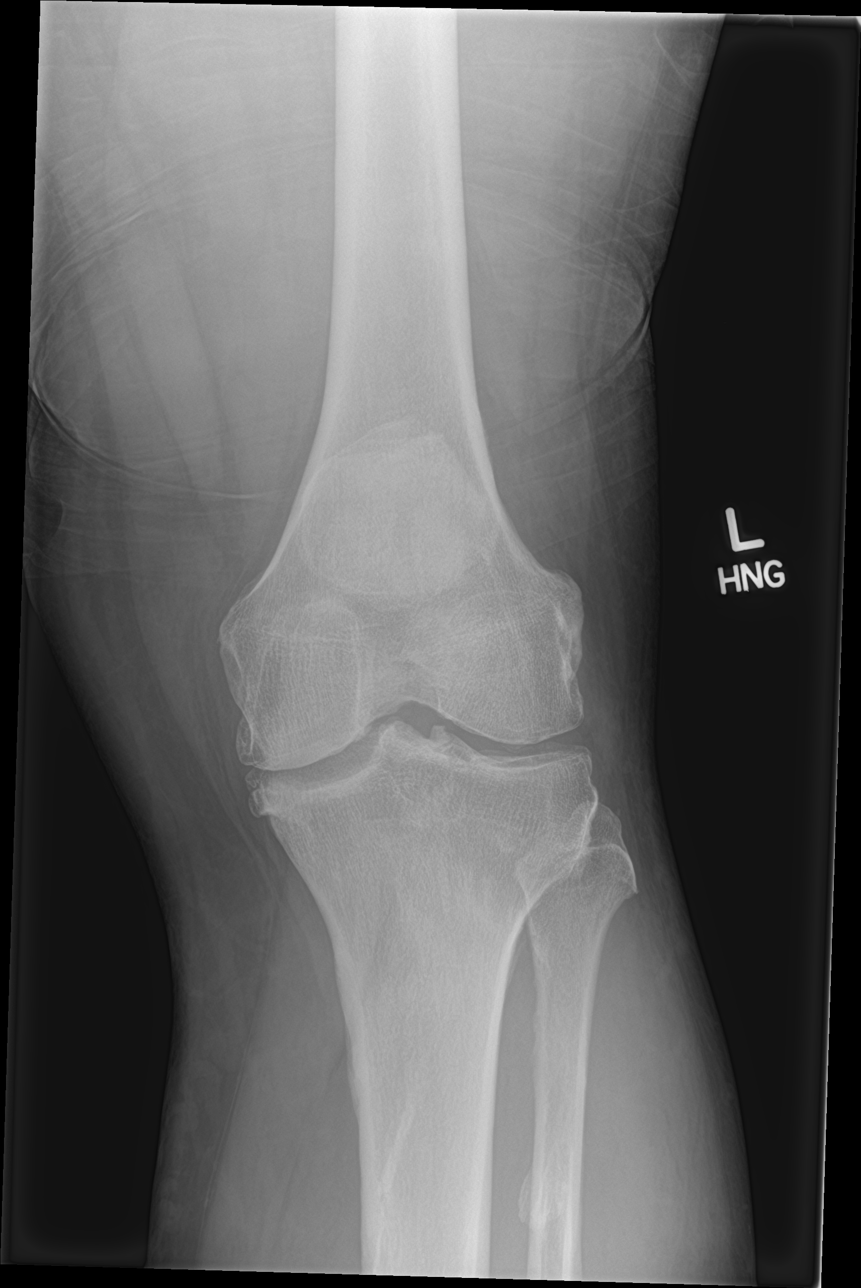
[im 2/4]
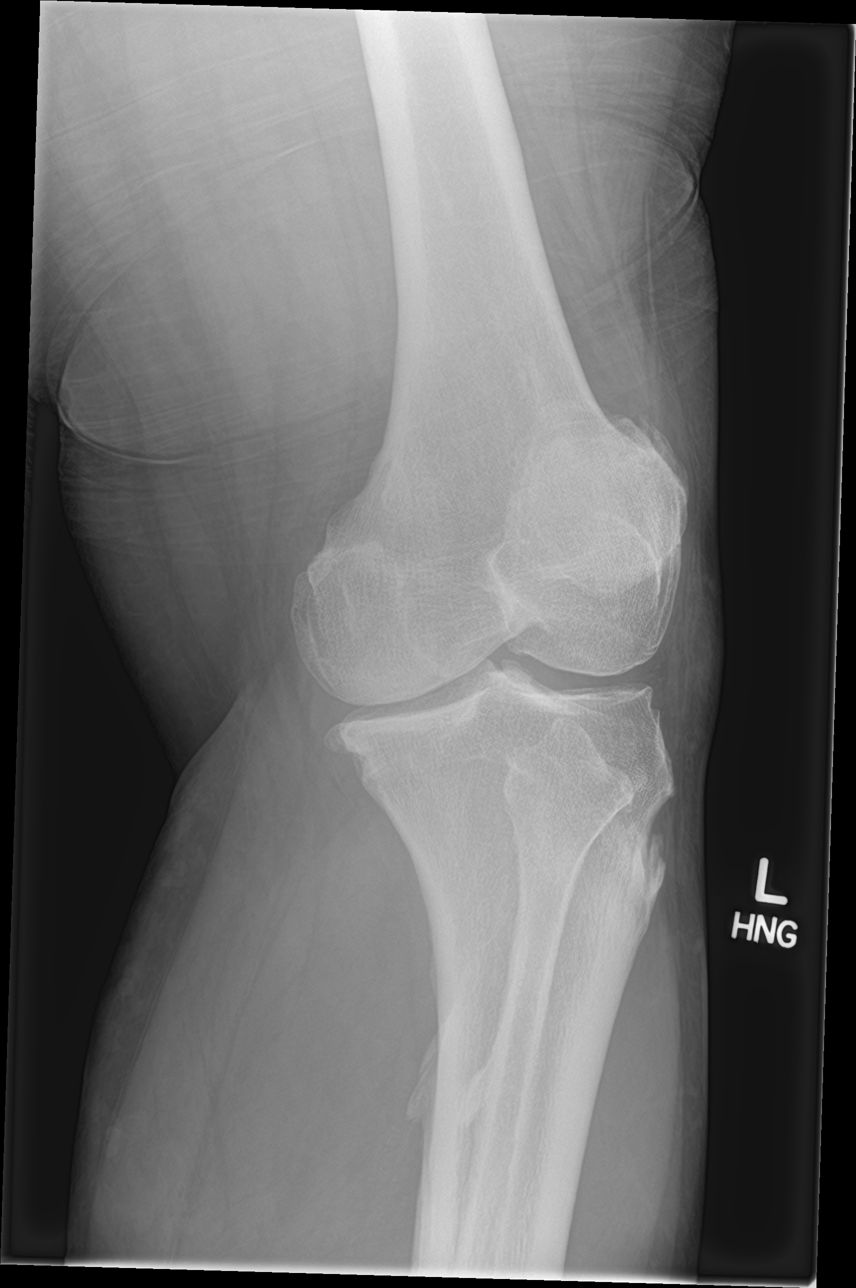
[im 3/4]
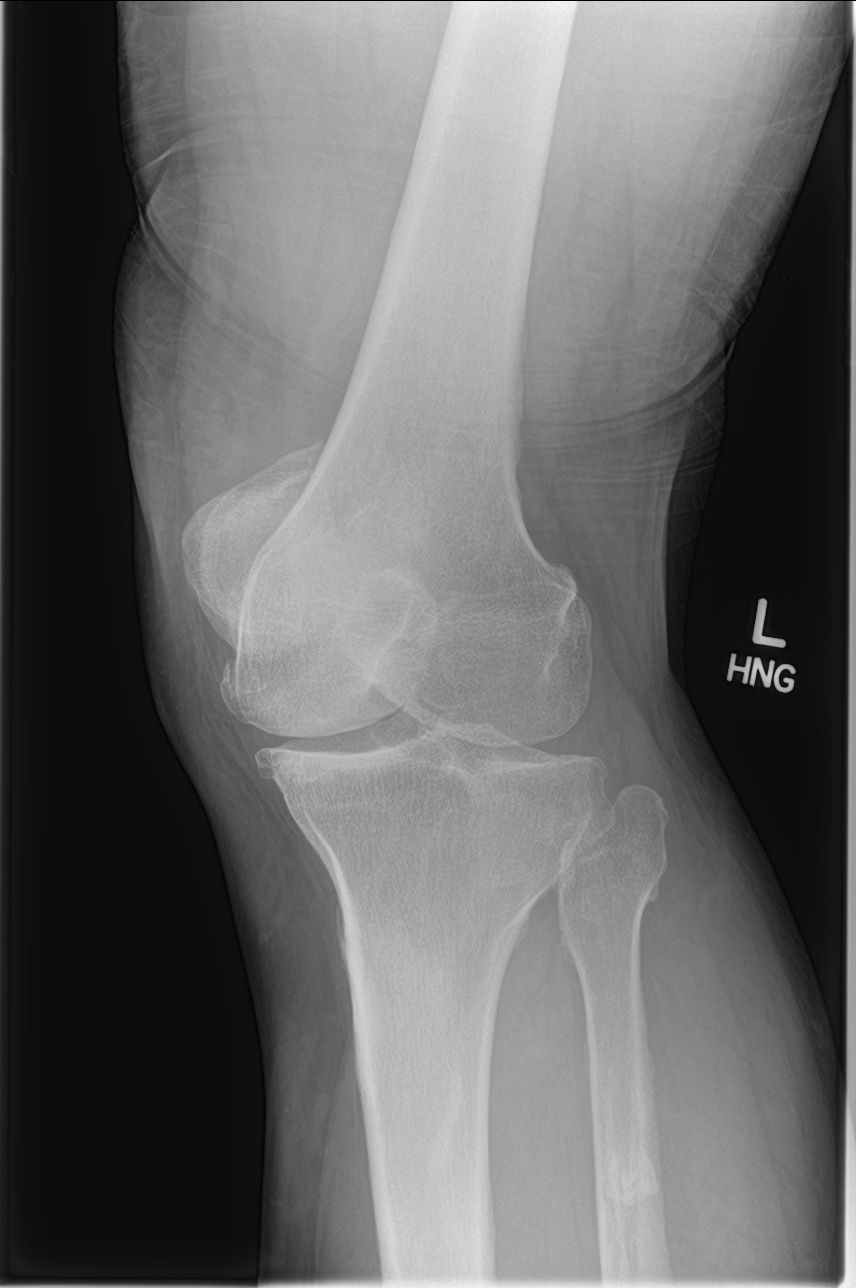
[im 4/4]
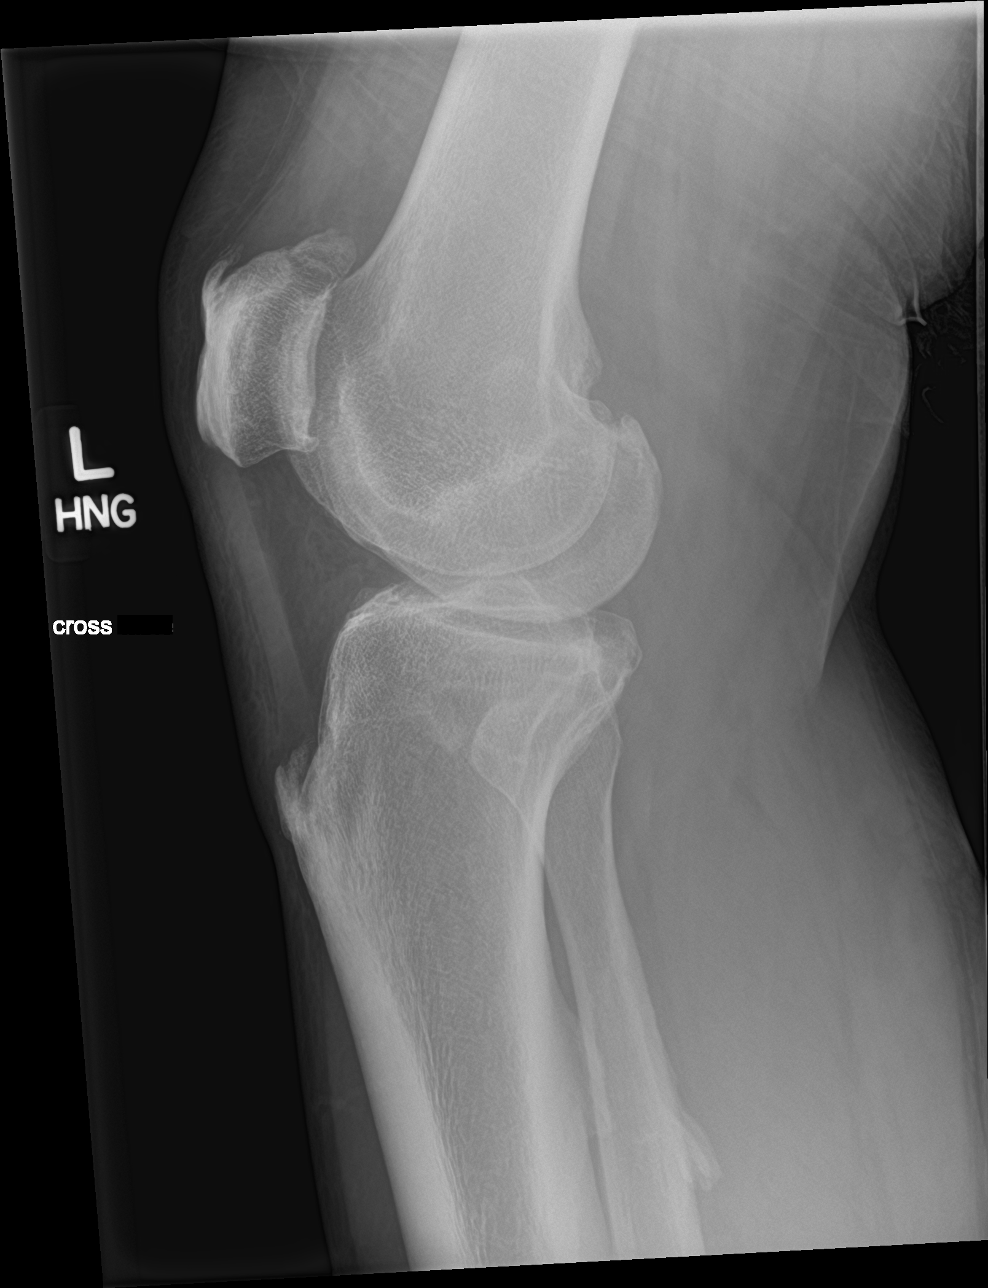

[4 of 4 positions shown; findings below may reference images not displayed]

FINDINGS: Moderate tricompartmental degenerative disease with significant
proliferative changes. The patellofemoral joint shows particularly
significant proliferative disease. Medial and lateral joint spaces
show mild to moderate narrowing with more prominent medial
proliferative disease. No fracture, dislocation, joint effusion or
bony lesion identified.
IMPRESSION: Moderate tricompartmental osteoarthritis of the left knee.

## 2019-12-27 ENCOUNTER — Ambulatory Visit: Payer: 59 | Attending: Internal Medicine

## 2019-12-27 DIAGNOSIS — Z23 Encounter for immunization: Secondary | ICD-10-CM

## 2019-12-27 NOTE — Progress Notes (Signed)
   Covid-19 Vaccination Clinic  Name:  John Pennington    MRN: [id number] DOB: 1960-10-05  12/27/2019  [name] was observed post Covid-19 immunization for 15 minutes without incident. He was provided with Vaccine Information Sheet and instruction to access the V-Safe system.   [name] was instructed to call 911 with any severe reactions post vaccine: Marland Kitchen Difficulty breathing  . Swelling of face and throat  . A fast heartbeat  . A bad rash all over body  . Dizziness and weakness   Immunizations Administered    Name Date Dose VIS Date Route   Pfizer COVID-19 Vaccine 12/27/2019  8:13 AM 0.3 mL 09/13/2019 Intramuscular   Manufacturer: [employer], [employer]   Lot: [id number]   NDC: [address]-6

## 2020-01-21 ENCOUNTER — Ambulatory Visit: Payer: 59 | Attending: Internal Medicine

## 2020-01-21 DIAGNOSIS — Z23 Encounter for immunization: Secondary | ICD-10-CM

## 2020-01-21 NOTE — Progress Notes (Signed)
   Covid-19 Vaccination Clinic  Name:  John Pennington    MRN: [id number] DOB: 09/01/1961  01/21/2020  [name] was observed post Covid-19 immunization for 15 minutes without incident. He was provided with Vaccine Information Sheet and instruction to access the V-Safe system.   [name] was instructed to call 911 with any severe reactions post vaccine: Marland Kitchen Difficulty breathing  . Swelling of face and throat  . A fast heartbeat  . A bad rash all over body  . Dizziness and weakness   Immunizations Administered    Name Date Dose VIS Date Route   Pfizer COVID-19 Vaccine 01/21/2020  8:06 AM 0.3 mL 11/27/2018 Intramuscular   Manufacturer: [employer], [employer]   Lot: [id number]   NDC: [address]-6

## 2022-05-17 ENCOUNTER — Ambulatory Visit (INDEPENDENT_AMBULATORY_CARE_PROVIDER_SITE_OTHER): Payer: 59 | Admitting: Podiatry

## 2022-05-17 DIAGNOSIS — L603 Nail dystrophy: Secondary | ICD-10-CM | POA: Diagnosis not present

## 2022-05-17 NOTE — Progress Notes (Signed)
   Chief Complaint  Patient presents with   Nail Problem    Patient is here for dark colored great toe nail, patient dropped something on the toe a while ago.    HPI: 61 y.o. male presenting today as a new patient for evaluation of discoloration with thickening to the right great toenail after an injury that was sustained at work.  Patient states that he dropped a heavy metal object on his right toenail.  This happened about 6 months ago.  Ever since that time it has been discolored and thickened.  Presenting for further treatment and evaluation  Past Medical History:  Diagnosis Date   Hypertension     Past Surgical History:  Procedure Laterality Date   KNEE SURGERY Left    SHOULDER SURGERY Right     No Known Allergies   Physical Exam: General: The patient is alert and oriented x3 in no acute distress.  Dermatology: There is a hyperkeratotic nail with discoloration and thickening to the right hallux nail plate.  It is loosely adhered to the distal portion of the nail plate.  Findings are consistent with stasis given history of injury  Vascular: Palpable pedal pulses bilaterally. Capillary refill within normal limits.  Negative for any significant edema or erythema  Neurological: Light touch and protective threshold grossly intact  Musculoskeletal Exam: No pedal deformities noted   Assessment: 1.  Dystrophic nail right hallux nail plate secondary to injury   Plan of Care:  1. Patient evaluated.  2.  The distal portion of the nail plate was debrided using a nail nipper without incident or bleeding. 3.  Topical Tolcylen antifungal/nail rejuvenation was dispensed at checkout to apply daily.  Recommend daily application as the nail grows out 4.  Return to clinic as needed     Felecia Shelling, DPM [facility]  Dr. Felecia Shelling, DPM    [address] [place], [place] [address]                Office [phone]  Fax  [phone]

## 2024-03-31 ENCOUNTER — Ambulatory Visit (INDEPENDENT_AMBULATORY_CARE_PROVIDER_SITE_OTHER)

## 2024-03-31 ENCOUNTER — Ambulatory Visit
Admission: EM | Admit: 2024-03-31 | Discharge: 2024-03-31 | Disposition: A | Discharge status: Home / Self Care | Attending: Physician Assistant | Admitting: Physician Assistant

## 2024-03-31 DIAGNOSIS — J209 Acute bronchitis, unspecified: Secondary | ICD-10-CM | POA: Diagnosis not present

## 2024-03-31 DIAGNOSIS — R052 Subacute cough: Secondary | ICD-10-CM

## 2024-03-31 MED ORDER — ALBUTEROL SULFATE HFA 108 (90 BASE) MCG/ACT IN AERS: 1.0000 | INHALATION_SPRAY | Freq: Four times a day (QID) | RESPIRATORY_TRACT | 0 refills | Status: AC | PRN

## 2024-03-31 MED ORDER — PREDNISONE 10 MG (21) PO TBPK: ORAL_TABLET | ORAL | 0 refills | Status: AC

## 2024-03-31 MED ORDER — BENZONATATE 100 MG PO CAPS: 100.0000 mg | ORAL_CAPSULE | Freq: Three times a day (TID) | ORAL | 0 refills | Status: AC

## 2024-03-31 NOTE — ED Triage Notes (Signed)
 Patient to Urgent Care with complaints of congested cough. Denies any fevers.   Symptoms x1 month.   Meds: Mucinex/ otc allergy meds/ sick tea with lemon and cayenne.

## 2024-03-31 NOTE — ED Provider Notes (Signed)
 John Pennington    CSN: [id number] Arrival date & time: 03/31/24  1356      History   Chief Complaint Chief Complaint  Patient presents with   Cough   Sore Throat    HPI Kanav Kazmierczak is a 63 y.o. male.   Patient presents today with a month-long history of intermittent cough and congestion symptoms.  He reports that he thought symptoms were improving but then in the past few weeks they have recurred and he is now having difficulty sleeping at night because of the severity of cough.  He did go to the [facility] and they started him on an allergy medication but this has been ineffective.  He has also tried Mucinex day and night formulations as well as teas without improvement.  Denies any known sick contacts.  He denies history of allergies, asthma, COPD, smoking.  Denies any recent antibiotics or steroids.  He denies any history of diabetes.    Past Medical History:  Diagnosis Date   Hypertension     There are no active problems to display for this patient.   Past Surgical History:  Procedure Laterality Date   KNEE SURGERY Left    SHOULDER SURGERY Right        Home Medications    Prior to Admission medications   Medication Sig Start Date End Date Taking? Authorizing Provider  albuterol (VENTOLIN HFA) 108 (90 Base) MCG/ACT inhaler Inhale 1-2 puffs into the lungs every 6 (six) hours as needed for wheezing or shortness of breath. 03/31/24  Yes Jermiyah Ricotta K, PA-C  benzonatate (TESSALON) 100 MG capsule Take 1 capsule (100 mg total) by mouth every 8 (eight) hours. 03/31/24  Yes Fontella Shan K, PA-C  predniSONE (STERAPRED UNI-PAK 21 TAB) 10 MG (21) TBPK tablet As directed 03/31/24  Yes Maurisha Mongeau K, PA-C  buPROPion (WELLBUTRIN XL) 300 MG 24 hr tablet Take 300 mg by mouth daily. 10/05/18   [provider]  chlorthalidone (HYGROTON) 25 MG tablet Take 25 mg by mouth daily. 05/25/17   [provider]  fluticasone (FLONASE) 50 MCG/ACT nasal spray Place 2 sprays  into both nostrils daily as needed for allergies or rhinitis.    [provider]  gabapentin (NEURONTIN) 100 MG capsule Take 300 mg by mouth Nightly. 08/19/18   [provider]  meloxicam (MOBIC) 15 MG tablet Take 15 mg by mouth daily. 05/25/17   [provider]  ondansetron  (ZOFRAN  ODT) 4 MG disintegrating tablet Take 1 tablet (4 mg total) by mouth every 8 (eight) hours as needed. 11/26/18   Pasco Alexandria, MD  sildenafil (VIAGRA) 100 MG tablet Take 100 mg by mouth daily. 11/22/18   [provider]  traMADol  (ULTRAM ) 50 MG tablet Take 1 tablet (50 mg total) by mouth every 6 (six) hours as needed. 09/03/17   Herlinda Kirk NOVAK, FNP    Family History History reviewed. No pertinent family history.  Social History Social History   Tobacco Use   Smoking status: Never   Smokeless tobacco: Never  Substance Use Topics   Alcohol use: No   Drug use: Not Currently    Types: Marijuana     Allergies   Patient has no known allergies.   Review of Systems Review of Systems  Constitutional:  Positive for activity change. Negative for appetite change, fatigue and fever.  HENT:  Positive for congestion and postnasal drip. Negative for sinus pressure, sneezing and sore throat.   Respiratory:  Positive for cough. Negative  for shortness of breath.   Cardiovascular:  Negative for chest pain.  Gastrointestinal:  Negative for abdominal pain, diarrhea, nausea and vomiting.  Neurological:  Negative for dizziness, light-headedness and headaches.     Physical Exam Triage Vital Signs ED Triage Vitals  Encounter Vitals Group     BP 03/31/24 1446 125/83     Girls Systolic BP Percentile --      Girls Diastolic BP Percentile --      Boys Systolic BP Percentile --      Boys Diastolic BP Percentile --      Pulse Rate 03/31/24 1446 84     Resp 03/31/24 1446 18     Temp 03/31/24 1446 98.4 F (36.9 C)     Temp src --      SpO2 03/31/24 1446 98 %     Weight --       Height --      Head Circumference --      Peak Flow --      Pain Score 03/31/24 1444 0     Pain Loc --      Pain Education --      Exclude from Growth Chart --    No data found.  Updated Vital Signs BP 125/83   Pulse 84   Temp 98.4 F (36.9 C)   Resp 18   SpO2 98%   Visual Acuity Right Eye Distance:   Left Eye Distance:   Bilateral Distance:    Right Eye Near:   Left Eye Near:    Bilateral Near:     Physical Exam Vitals reviewed.  Constitutional:      General: He is awake.     Appearance: Normal appearance. He is well-developed. He is not ill-appearing.     Comments: Very pleasant male appears stated age in no acute distress sitting comfortably in exam room  HENT:     Head: Normocephalic and atraumatic.     Right Ear: Tympanic membrane, ear canal and external ear normal. Tympanic membrane is not erythematous or bulging.     Left Ear: Tympanic membrane, ear canal and external ear normal. Tympanic membrane is not erythematous or bulging.     Nose: Nose normal.     Mouth/Throat:     Pharynx: Uvula midline. Postnasal drip present. No oropharyngeal exudate, posterior oropharyngeal erythema or uvula swelling.   Cardiovascular:     Rate and Rhythm: Normal rate and regular rhythm.     Heart sounds: Normal heart sounds, S1 normal and S2 normal. No murmur heard. Pulmonary:     Effort: Pulmonary effort is normal. No accessory muscle usage or respiratory distress.     Breath sounds: No stridor. Examination of the right-lower field reveals decreased breath sounds. Examination of the left-lower field reveals decreased breath sounds. Decreased breath sounds present. No wheezing, rhonchi or rales.   Neurological:     Mental Status: He is alert.   Psychiatric:        Behavior: Behavior is cooperative.      UC Treatments / Results  Labs (all labs ordered are listed, but only abnormal results are displayed) Labs Reviewed - No data to display  EKG   Radiology DG Chest 2  View Result Date: 03/31/2024 CLINICAL DATA:  Cough for 1 month. EXAM: CHEST - 2 VIEW COMPARISON:  None Available. FINDINGS: The heart size and mediastinal contours are within normal limits. Mild bilateral perihilar interstitial prominence and peribronchial cuffing, as can be seen in the setting of  asthma or bronchitis. No focal consolidation, pleural effusion, or pneumothorax. Degenerative changes of the acromioclavicular joint. No acute osseous abnormality. IMPRESSION: Mild bilateral perihilar interstitial prominence and peribronchial cuffing, as can be seen in the setting of asthma or bronchitis. No focal consolidation. Electronically Signed   By: Harrietta Sherry M.D.   On: 03/31/2024 15:23    Procedures Procedures (including critical care time)  Medications Ordered in UC Medications - No data to display  Initial Impression / Assessment and Plan / UC Course  I have reviewed the triage vital signs and the nursing notes.  Pertinent labs & imaging results that were available during my care of the patient were reviewed by me and considered in my medical decision making (see chart for details).     Patient is well-appearing, afebrile, nontoxic, nontachycardic.  Viral testing was deferred as patient has been symptomatic for several weeks.  No evidence of acute infection on physical exam that warrant initiation of antibiotics.  Chest x-ray was obtained that showed peribronchial thickening without focal consolidation consistent with reactive airway disease/bronchitis.  Will treat with prednisone taper and he was instructed to take NSAIDs with this medication including over-the-counter NSAIDs as well as his previously prescribed meloxicam due to risk of GI bleeding.  He can use Tylenol , Mucinex, Flonase, nasal saline sinus rinses for additional symptom relief.  He was given Tessalon to help with cough.  He was provided albuterol to help with shortness of breath and coughing fits.  We discussed that if his  symptoms are improving within a few days or if he has any worsening symptoms including worsening cough, shortness of breath, chest pain, nausea/vomiting he is to be seen immediately.  Strict return precautions given.  Work excuse note provided.  All questions answered to patient satisfaction.  Final Clinical Impressions(s) / UC Diagnoses   Final diagnoses:  Subacute cough  Acute bronchitis, unspecified organism     Discharge Instructions      Your x-ray did not show any evidence of pneumonia.  There was inflammation in your lungs consistent with bronchitis.  Start prednisone as prescribed.  Do not take NSAIDs with this medication including aspirin, ibuprofen/Advil, naproxen/Aleve.  Use Tessalon for cough.  You can use over-the-counter medication including Mucinex, Tylenol , nasal saline/sinus rinses for additional symptom relief.  If you are not feeling better within a few days please return here or see your primary care.  If anything worsens and you have shortness of breath, worsening cough, fever, nausea/vomiting interfering with oral intake you need to be seen immediately.     ED Prescriptions     Medication Sig Dispense Auth. Provider   predniSONE (STERAPRED UNI-PAK 21 TAB) 10 MG (21) TBPK tablet As directed 21 tablet Najee Manninen K, PA-C   benzonatate (TESSALON) 100 MG capsule Take 1 capsule (100 mg total) by mouth every 8 (eight) hours. 21 capsule Errik Mitchelle K, PA-C   albuterol (VENTOLIN HFA) 108 (90 Base) MCG/ACT inhaler Inhale 1-2 puffs into the lungs every 6 (six) hours as needed for wheezing or shortness of breath. 18 g Lakeem Rozo K, PA-C      PDMP not reviewed this encounter.   Sherrell Rocky POUR, PA-C 03/31/24 1545

## 2024-03-31 NOTE — Discharge Instructions (Signed)
 Your x-ray did not show any evidence of pneumonia.  There was inflammation in your lungs consistent with bronchitis.  Start prednisone as prescribed.  Do not take NSAIDs with this medication including aspirin, ibuprofen/Advil, naproxen/Aleve.  Use Tessalon for cough.  You can use over-the-counter medication including Mucinex, Tylenol , nasal saline/sinus rinses for additional symptom relief.  If you are not feeling better within a few days please return here or see your primary care.  If anything worsens and you have shortness of breath, worsening cough, fever, nausea/vomiting interfering with oral intake you need to be seen immediately.
